# Patient Record
Sex: Female | Born: 1965 | Race: Black or African American | Hispanic: No | Marital: Single | Smoking: Never smoker
Health system: Southern US, Community
[De-identification: ages and names within clinical notes are randomized; demographics above are authoritative.]

## PROBLEM LIST (undated history)

## (undated) DIAGNOSIS — I10 Essential (primary) hypertension: Secondary | ICD-10-CM

## (undated) HISTORY — DX: Essential (primary) hypertension: I10

## (undated) HISTORY — PX: ANKLE FRACTURE SURGERY: SHX122

## (undated) HISTORY — PX: CHOLECYSTECTOMY: SHX55

---

## 2002-06-14 ENCOUNTER — Emergency Department (HOSPITAL_COMMUNITY): Admission: EM | Admit: 2002-06-14 | Discharge: 2002-06-14 | Payer: Self-pay | Admitting: Emergency Medicine

## 2003-06-15 ENCOUNTER — Emergency Department (HOSPITAL_COMMUNITY): Admission: EM | Admit: 2003-06-15 | Discharge: 2003-06-15 | Payer: Self-pay | Admitting: Emergency Medicine

## 2003-09-27 ENCOUNTER — Encounter: Payer: Self-pay | Admitting: Emergency Medicine

## 2003-09-27 ENCOUNTER — Emergency Department (HOSPITAL_COMMUNITY): Admission: EM | Admit: 2003-09-27 | Discharge: 2003-09-27 | Payer: Self-pay | Admitting: Emergency Medicine

## 2003-09-28 ENCOUNTER — Ambulatory Visit (HOSPITAL_COMMUNITY): Admission: RE | Admit: 2003-09-28 | Discharge: 2003-09-28 | Payer: Self-pay | Admitting: Unknown Physician Specialty

## 2003-09-28 ENCOUNTER — Encounter: Payer: Self-pay | Admitting: Unknown Physician Specialty

## 2005-01-09 ENCOUNTER — Ambulatory Visit: Payer: Self-pay | Admitting: Family Medicine

## 2005-02-17 ENCOUNTER — Ambulatory Visit: Payer: Self-pay | Admitting: Family Medicine

## 2005-02-21 ENCOUNTER — Ambulatory Visit: Payer: Self-pay | Admitting: Family Medicine

## 2005-05-23 ENCOUNTER — Ambulatory Visit: Payer: Self-pay | Admitting: Family Medicine

## 2005-06-06 ENCOUNTER — Ambulatory Visit: Payer: Self-pay | Admitting: Family Medicine

## 2005-07-24 ENCOUNTER — Ambulatory Visit: Payer: Self-pay | Admitting: Family Medicine

## 2005-08-12 ENCOUNTER — Ambulatory Visit: Payer: Self-pay | Admitting: Family Medicine

## 2005-08-27 ENCOUNTER — Ambulatory Visit: Payer: Self-pay | Admitting: Family Medicine

## 2005-09-26 ENCOUNTER — Ambulatory Visit: Payer: Self-pay | Admitting: Family Medicine

## 2005-11-18 ENCOUNTER — Ambulatory Visit: Payer: Self-pay | Admitting: Family Medicine

## 2005-12-01 ENCOUNTER — Ambulatory Visit: Payer: Self-pay | Admitting: Family Medicine

## 2005-12-18 ENCOUNTER — Ambulatory Visit: Payer: Self-pay | Admitting: Family Medicine

## 2006-03-24 ENCOUNTER — Ambulatory Visit: Payer: Self-pay | Admitting: Family Medicine

## 2009-11-27 ENCOUNTER — Emergency Department (HOSPITAL_COMMUNITY): Admission: EM | Admit: 2009-11-27 | Discharge: 2009-11-27 | Payer: Self-pay | Admitting: Emergency Medicine

## 2017-09-14 DIAGNOSIS — Z87891 Personal history of nicotine dependence: Secondary | ICD-10-CM | POA: Insufficient documentation

## 2017-09-14 DIAGNOSIS — D72819 Decreased white blood cell count, unspecified: Secondary | ICD-10-CM | POA: Insufficient documentation

## 2017-09-14 DIAGNOSIS — I10 Essential (primary) hypertension: Secondary | ICD-10-CM | POA: Insufficient documentation

## 2019-04-29 DIAGNOSIS — N644 Mastodynia: Secondary | ICD-10-CM | POA: Diagnosis not present

## 2019-04-29 DIAGNOSIS — I10 Essential (primary) hypertension: Secondary | ICD-10-CM | POA: Diagnosis not present

## 2019-04-29 DIAGNOSIS — Z1239 Encounter for other screening for malignant neoplasm of breast: Secondary | ICD-10-CM | POA: Diagnosis not present

## 2019-05-02 DIAGNOSIS — Z1211 Encounter for screening for malignant neoplasm of colon: Secondary | ICD-10-CM | POA: Diagnosis not present

## 2019-11-21 ENCOUNTER — Other Ambulatory Visit: Payer: Self-pay

## 2019-11-21 DIAGNOSIS — Z20822 Contact with and (suspected) exposure to covid-19: Secondary | ICD-10-CM

## 2019-11-22 LAB — NOVEL CORONAVIRUS, NAA: SARS-CoV-2, NAA: NOT DETECTED

## 2020-02-10 ENCOUNTER — Telehealth: Payer: Self-pay | Admitting: Physician Assistant

## 2020-02-10 ENCOUNTER — Other Ambulatory Visit: Payer: Self-pay

## 2020-02-10 ENCOUNTER — Encounter: Payer: Self-pay | Admitting: Physician Assistant

## 2020-02-10 DIAGNOSIS — K219 Gastro-esophageal reflux disease without esophagitis: Secondary | ICD-10-CM | POA: Insufficient documentation

## 2020-02-10 DIAGNOSIS — I10 Essential (primary) hypertension: Secondary | ICD-10-CM

## 2020-02-10 NOTE — Progress Notes (Signed)
Unable to reach through video link

## 2020-05-11 ENCOUNTER — Other Ambulatory Visit: Payer: Self-pay | Admitting: Family Medicine

## 2020-05-11 ENCOUNTER — Other Ambulatory Visit: Payer: Self-pay | Admitting: *Deleted

## 2020-05-11 ENCOUNTER — Telehealth: Payer: Self-pay | Admitting: Physician Assistant

## 2020-05-11 MED ORDER — BENAZEPRIL HCL 20 MG PO TABS: 20.0000 mg | ORAL_TABLET | Freq: Every day | ORAL | 0 refills | Status: DC

## 2020-05-11 MED ORDER — AMLODIPINE BESYLATE 10 MG PO TABS: 10.0000 mg | ORAL_TABLET | Freq: Every day | ORAL | 0 refills | Status: DC

## 2020-05-11 NOTE — Telephone Encounter (Signed)
Scheduled with new provider , Dr Tonny Branch June.  Former [facility] patient. Scripts are pending.

## 2020-05-11 NOTE — Telephone Encounter (Signed)
°  Prescription Request  05/11/2020  What is the name of the medication or equipment? amLODipine (NORVASC) 10 MG tablet  benazepril (LOTENSIN) 20 MG tablet  Have you contacted your pharmacy to request a refill? (if applicable) yes  Which pharmacy would you like this sent to? [facility] pt has appt with Dr. Reece Agar on 05/22/20   Patient notified that their request is being sent to the clinical staff for review and that they should receive a response within 2 business days.

## 2020-05-20 NOTE — Progress Notes (Signed)
Deanna Kennedy is a 54 y.o. female presents to office today for annual physical exam examination.    Concerns today include: 1.  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  Up to 6 beers per day on the weekends.  No alcohol use during the week.  She is trying to cut back on this Diet: Low-carb, high-fiber, Exercise: Regular structured exercise either on the elliptical or walking.  She is working on weight loss with successful 37 pound weight loss thus far Last eye exam: Needs.  Has not establish care with a new eye doctor locally Last colonoscopy: Never, would like to establish care in [place] Last mammogram: Needs at [facility] center.  No history of abnormal Last pap smear: Needs.  No history of abnormal.  No pelvic concerns  Past Medical History:  Diagnosis Date  . Hypertension    Social History   Socioeconomic History  . Marital status: Single    Spouse name: Not on file  . Number of children: 2  . Years of education: Not on file  . Highest education level: Not on file  Occupational History  . Not on file  Tobacco Use  . Smoking status: Never Smoker  . Smokeless tobacco: Never Used  Substance and Sexual Activity  . Alcohol use: Yes    Alcohol/week: 6.0 standard drinks    Types: 6 Cans of beer per week  . Drug use: Not on file  . Sexual activity: Not Currently  Other Topics Concern  . Not on file  Social History Narrative   Patient resides locally.  She used to work for [employer] and travels quite frequently.  However, she is now working for [employer] in [employer].  She has 1 daughter which resides in [place] and her other day her daughter resides in [place].   Social Determinants of Health   Financial Resource Strain:   . Difficulty of Paying Living Expenses:   Food Insecurity:   . Worried About Charity fundraiser in the Last Year:   . Arboriculturist in the  Last Year:   Transportation Needs:   . Film/video editor (Medical):   Marland Kitchen Lack of Transportation (Non-Medical):   Physical Activity:   . Days of Exercise per Week:   . Minutes of Exercise per Session:   Stress:   . Feeling of Stress :   Social Connections:   . Frequency of Communication with Friends and Family:   . Frequency of Social Gatherings with Friends and Family:   . Attends Religious Services:   . Active Member of Clubs or Organizations:   . Attends Archivist Meetings:   Marland Kitchen Marital Status:   Intimate Partner Violence:   . Fear of Current or Ex-Partner:   . Emotionally Abused:   Marland Kitchen Physically Abused:   . Sexually Abused:    Past Surgical History:  Procedure Laterality Date  . ANKLE FRACTURE SURGERY Left   . CESAREAN SECTION     x2  . CHOLECYSTECTOMY     Family History  Problem Relation Age of Onset  . Hypertension Mother   . Uterine cancer Mother   . Hypertension Father   . Stomach cancer Father     Current Outpatient Medications:  .  amLODipine (NORVASC) 10 MG tablet, Take 1 tablet (10 mg total) by mouth daily., Disp: 90 tablet, Rfl: 0 .  benazepril (LOTENSIN) 20 MG tablet, Take 1 tablet (  20 mg total) by mouth daily., Disp: 90 tablet, Rfl: 0 .  cetirizine (ZYRTEC) 10 MG tablet, Take 10 mg by mouth daily., Disp: , Rfl:  .  hydrochlorothiazide (MICROZIDE) 12.5 MG capsule, Take 12.5 mg by mouth daily., Disp: , Rfl:  .  omeprazole (PRILOSEC) 20 MG capsule, Take 20 mg by mouth daily., Disp: , Rfl:   Allergies  Allergen Reactions  . Penicillins Hives     ROS: Review of Systems A comprehensive review of systems was negative except for: Eyes: positive for contacts/glasses Musculoskeletal: positive for arthralgias    Physical exam BP 124/88   Pulse 99   Temp (!) 97.3 F (36.3 C) (Temporal)   Ht 7' (2.134 m)   Wt 200 lb (90.7 kg)   LMP  (LMP Unknown)   SpO2 98%   BMI 19.93 kg/m  General appearance: alert, cooperative, appears stated age, no  distress and mildly obese Head: Normocephalic, without obvious abnormality, atraumatic Eyes: negative findings: lids and lashes normal, conjunctivae and sclerae normal, corneas clear and pupils equal, round, reactive to light and accomodation Ears: normal TM's and external ear canals both ears Nose: Nares normal. Septum midline. Mucosa normal. No drainage or sinus tenderness. Throat: lips, mucosa, and tongue normal; teeth and gums normal Neck: no adenopathy, supple, symmetrical, trachea midline and thyroid not enlarged, symmetric, no tenderness/mass/nodules Back: symmetric, no curvature. ROM normal. No CVA tenderness. Lungs: clear to auscultation bilaterally Breasts: normal appearance, no masses or tenderness, Inspection negative, No nipple retraction or dimpling, No nipple discharge or bleeding, No axillary or supraclavicular adenopathy Heart: regular rate and rhythm, S1, S2 normal, no murmur, click, rub or gallop Abdomen: soft, non-tender; bowel sounds normal; no masses,  no organomegaly Pelvic: cervix normal in appearance, external genitalia normal, no adnexal masses or tenderness, no cervical motion tenderness, rectovaginal septum normal, uterus normal size, shape, and consistency and vagina normal without discharge Extremities: extremities normal, atraumatic, no cyanosis or edema Pulses: 2+ and symmetric Skin: Skin color, texture, turgor normal. No rashes or lesions Lymph nodes: Cervical, supraclavicular, and axillary nodes normal. Neurologic: Alert and oriented X 3, normal strength and tone. Normal symmetric reflexes. Normal coordination and gait Psych: Mood stable, speech normal, affect appropriate, pleasant and interactive Depression screen [facility] 2/9 05/22/2020  Decreased Interest 0  Down, Depressed, Hopeless 0  PHQ - 2 Score 0  Altered sleeping 0  Tired, decreased energy 0  Change in appetite 0  Feeling bad or failure about yourself  0  Trouble concentrating 0  Moving slowly or  fidgety/restless 0  Suicidal thoughts 0  PHQ-9 Score 0  Difficult doing work/chores Not difficult at all    Assessment/ Plan: [name] here for annual physical exam.   1. Annual physical exam  2. Screening for malignant neoplasm of cervix - Cytology - PAP([place])  3. Essential (primary) hypertension Controlled.  Continue current regimen.  We discussed that if her blood pressure continues to go down as she loses weight and it gets below 110/60 will plan to reduce the Norvasc to 5 mg daily.  4. Establishing care with new doctor, encounter for  5. Family history of thyroid disease Check thyroid level - TSH  6. Elevated serum glucose Noted on recent biometric scan at work.  She is on a low-carb diet so this is somewhat surprising.  Check A1c - Bayer DCA Hb A1c Waived  7. Screen for colon cancer No history of colonoscopy.  Referral to gastro - Ambulatory referral to Gastroenterology  8.  Encounter for screening mammogram for malignant neoplasm of breast Gets done at [facility] center in [place] - MM 3D SCREEN BREAST BILATERAL; Future   Handout on healthy lifestyle choices, including diet (rich in fruits, vegetables and lean meats and low in salt and simple carbohydrates) and exercise (at least 30 minutes of moderate physical activity daily).  Patient to follow up in 1 year for annual exam or sooner if needed.  Giovanny Dugal M. Nadine Counts, DO

## 2020-05-22 ENCOUNTER — Other Ambulatory Visit: Payer: Self-pay

## 2020-05-22 ENCOUNTER — Encounter: Payer: Self-pay | Admitting: Family Medicine

## 2020-05-22 ENCOUNTER — Ambulatory Visit (INDEPENDENT_AMBULATORY_CARE_PROVIDER_SITE_OTHER): Payer: 59 | Admitting: Family Medicine

## 2020-05-22 ENCOUNTER — Other Ambulatory Visit (HOSPITAL_COMMUNITY)
Admission: RE | Admit: 2020-05-22 | Discharge: 2020-05-22 | Disposition: A | Discharge status: Home / Self Care | Payer: 59 | Source: Ambulatory Visit | Attending: Family Medicine | Admitting: Family Medicine

## 2020-05-22 VITALS — BP 124/88 | HR 99 | Temp 97.3°F | Ht 67.0 in | Wt 200.0 lb

## 2020-05-22 DIAGNOSIS — Z0000 Encounter for general adult medical examination without abnormal findings: Secondary | ICD-10-CM

## 2020-05-22 DIAGNOSIS — Z1231 Encounter for screening mammogram for malignant neoplasm of breast: Secondary | ICD-10-CM

## 2020-05-22 DIAGNOSIS — I10 Essential (primary) hypertension: Secondary | ICD-10-CM | POA: Diagnosis not present

## 2020-05-22 DIAGNOSIS — Z8349 Family history of other endocrine, nutritional and metabolic diseases: Secondary | ICD-10-CM

## 2020-05-22 DIAGNOSIS — R739 Hyperglycemia, unspecified: Secondary | ICD-10-CM

## 2020-05-22 DIAGNOSIS — Z1211 Encounter for screening for malignant neoplasm of colon: Secondary | ICD-10-CM

## 2020-05-22 DIAGNOSIS — Z124 Encounter for screening for malignant neoplasm of cervix: Secondary | ICD-10-CM

## 2020-05-22 DIAGNOSIS — Z0001 Encounter for general adult medical examination with abnormal findings: Secondary | ICD-10-CM | POA: Diagnosis not present

## 2020-05-22 DIAGNOSIS — Z7689 Persons encountering health services in other specified circumstances: Secondary | ICD-10-CM

## 2020-05-22 LAB — BAYER DCA HB A1C WAIVED: HB A1C (BAYER DCA - WAIVED): 4.8 % (ref <7.0)

## 2020-05-22 MED ORDER — BENAZEPRIL HCL 20 MG PO TABS: 20.0000 mg | ORAL_TABLET | Freq: Every day | ORAL | 3 refills | Status: DC

## 2020-05-22 MED ORDER — CETIRIZINE HCL 10 MG PO TABS: 10.0000 mg | ORAL_TABLET | Freq: Every day | ORAL | 3 refills | Status: AC

## 2020-05-22 MED ORDER — AMLODIPINE BESYLATE 10 MG PO TABS: 10.0000 mg | ORAL_TABLET | Freq: Every day | ORAL | 3 refills | Status: DC

## 2020-05-22 MED ORDER — HYDROCHLOROTHIAZIDE 12.5 MG PO CAPS: 12.5000 mg | ORAL_CAPSULE | Freq: Every day | ORAL | 3 refills | Status: AC

## 2020-05-22 NOTE — Patient Instructions (Addendum)
You had labs performed today.  You will be contacted with the results of the labs once they are available, usually in the next 3 business days for routine lab work.  If you have an active my chart account, they will be released to your MyChart.  If you prefer to have these labs released to you via telephone, please let us know.  If you had a pap smear or biopsy performed, expect to be contacted in about 7-10 days.  Monitor your blood pressure regularly.  If your BP goes below 110/60, call me.  We will plan to reduce your Amlodipine.   Preventive Care 30-73 Years Old, Female Preventive care refers to visits with your health care provider and lifestyle choices that can promote health and wellness. This includes:  A yearly physical exam. This may also be called an annual well check.  Regular dental visits and eye exams.  Immunizations.  Screening for certain conditions.  Healthy lifestyle choices, such as eating a healthy diet, getting regular exercise, not using drugs or products that contain nicotine and tobacco, and limiting alcohol use. What can I expect for my preventive care visit? Physical exam Your health care provider will check your:  Height and weight. This may be used to calculate body mass index (BMI), which tells if you are at a healthy weight.  Heart rate and blood pressure.  Skin for abnormal spots. Counseling Your health care provider may ask you questions about your:  Alcohol, tobacco, and drug use.  Emotional well-being.  Home and relationship well-being.  Sexual activity.  Eating habits.  Work and work Statistician.  Method of birth control.  Menstrual cycle.  Pregnancy history. What immunizations do I need?  Influenza (flu) vaccine  This is recommended every year. Tetanus, diphtheria, and pertussis (Tdap) vaccine  You may need a Td booster every 10 years. Varicella (chickenpox) vaccine  You may need this if you have not been  vaccinated. Zoster (shingles) vaccine  You may need this after age 11. Measles, mumps, and rubella (MMR) vaccine  You may need at least one dose of MMR if you were born in 1957 or later. You may also need a second dose. Pneumococcal conjugate (PCV13) vaccine  You may need this if you have certain conditions and were not previously vaccinated. Pneumococcal polysaccharide (PPSV23) vaccine  You may need one or two doses if you smoke cigarettes or if you have certain conditions. Meningococcal conjugate (MenACWY) vaccine  You may need this if you have certain conditions. Hepatitis A vaccine  You may need this if you have certain conditions or if you travel or work in places where you may be exposed to hepatitis A. Hepatitis B vaccine  You may need this if you have certain conditions or if you travel or work in places where you may be exposed to hepatitis B. Haemophilus influenzae type b (Hib) vaccine  You may need this if you have certain conditions. Human papillomavirus (HPV) vaccine  If recommended by your health care provider, you may need three doses over 6 months. You may receive vaccines as individual doses or as more than one vaccine together in one shot (combination vaccines). Talk with your health care provider about the risks and benefits of combination vaccines. What tests do I need? Blood tests  Lipid and cholesterol levels. These may be checked every 5 years, or more frequently if you are over 28 years old.  Hepatitis C test.  Hepatitis B test. Screening  Lung cancer screening.  You may have this screening every year starting at age 37 if you have a 30-pack-year history of smoking and currently smoke or have quit within the past 15 years.  Colorectal cancer screening. All adults should have this screening starting at age 53 and continuing until age 57. Your health care provider may recommend screening at age 89 if you are at increased risk. You will have tests every  1-10 years, depending on your results and the type of screening test.  Diabetes screening. This is done by checking your blood sugar (glucose) after you have not eaten for a while (fasting). You may have this done every 1-3 years.  Mammogram. This may be done every 1-2 years. Talk with your health care provider about when you should start having regular mammograms. This may depend on whether you have a family history of breast cancer.  BRCA-related cancer screening. This may be done if you have a family history of breast, ovarian, tubal, or peritoneal cancers.  Pelvic exam and Pap test. This may be done every 3 years starting at age 53. Starting at age 16, this may be done every 5 years if you have a Pap test in combination with an HPV test. Other tests  Sexually transmitted disease (STD) testing.  Bone density scan. This is done to screen for osteoporosis. You may have this scan if you are at high risk for osteoporosis. Follow these instructions at home: Eating and drinking  Eat a diet that includes fresh fruits and vegetables, whole grains, lean protein, and low-fat dairy.  Take vitamin and mineral supplements as recommended by your health care provider.  Do not drink alcohol if: ? Your health care provider tells you not to drink. ? You are pregnant, may be pregnant, or are planning to become pregnant.  If you drink alcohol: ? Limit how much you have to 0-1 drink a day. ? Be aware of how much alcohol is in your drink. In the [place] one drink equals one 12 oz bottle of beer (355 mL), one 5 oz glass of wine (148 mL), or one 1 oz glass of hard liquor (44 mL). Lifestyle  Take daily care of your teeth and gums.  Stay active. Exercise for at least 30 minutes on 5 or more days each week.  Do not use any products that contain nicotine or tobacco, such as cigarettes, e-cigarettes, and chewing tobacco. If you need help quitting, ask your health care provider.  If you are sexually active,  practice safe sex. Use a condom or other form of birth control (contraception) in order to prevent pregnancy and STIs (sexually transmitted infections).  If told by your health care provider, take low-dose aspirin daily starting at age 54. What's next?  Visit your health care provider once a year for a well check visit.  Ask your health care provider how often you should have your eyes and teeth checked.  Stay up to date on all vaccines. This information is not intended to replace advice given to you by your health care provider. Make sure you discuss any questions you have with your health care provider. Document Revised: 08/19/2018 Document Reviewed: 08/19/2018 Elsevier Patient Education  2020 [employer].

## 2020-05-23 LAB — CYTOLOGY - PAP
Comment: NEGATIVE
Diagnosis: NEGATIVE
High risk HPV: NEGATIVE

## 2020-05-23 LAB — TSH: TSH: 1.39 u[IU]/mL (ref 0.450–4.500)

## 2020-05-24 ENCOUNTER — Encounter: Payer: Self-pay | Admitting: Internal Medicine

## 2020-05-28 ENCOUNTER — Encounter: Payer: Self-pay | Admitting: Family Medicine

## 2020-06-22 ENCOUNTER — Ambulatory Visit (HOSPITAL_COMMUNITY)
Admission: RE | Admit: 2020-06-22 | Discharge: 2020-06-22 | Disposition: A | Discharge status: Home / Self Care | Payer: 59 | Source: Ambulatory Visit | Attending: Family Medicine | Admitting: Family Medicine

## 2020-06-22 ENCOUNTER — Other Ambulatory Visit: Payer: Self-pay

## 2020-06-22 DIAGNOSIS — Z1231 Encounter for screening mammogram for malignant neoplasm of breast: Secondary | ICD-10-CM | POA: Diagnosis present

## 2020-06-28 ENCOUNTER — Encounter (HOSPITAL_COMMUNITY): Payer: 59

## 2020-08-02 ENCOUNTER — Ambulatory Visit: Payer: 59

## 2020-09-10 ENCOUNTER — Other Ambulatory Visit: Payer: Self-pay

## 2020-09-10 ENCOUNTER — Ambulatory Visit (INDEPENDENT_AMBULATORY_CARE_PROVIDER_SITE_OTHER): Payer: Self-pay | Admitting: *Deleted

## 2020-09-10 DIAGNOSIS — Z1211 Encounter for screening for malignant neoplasm of colon: Secondary | ICD-10-CM

## 2020-09-10 NOTE — Progress Notes (Signed)
Gastroenterology Pre-Procedure Review  Request Date: 09/10/2020 Requesting Physician: Doylene Canard, DO, no previous TCS  PATIENT REVIEW QUESTIONS: The patient responded to the following health history questions as indicated:    1. Diabetes Melitis: no 2. Joint replacements in the past 12 months: no 3. Major health problems in the past 3 months: no 4. Has an artificial valve or MVP: no 5. Has a defibrillator: no 6. Has been advised in past to take antibiotics in advance of a procedure like teeth cleaning: no 7. Family history of colon cancer: no  8. Alcohol Use: yes, 16 beers on weekends 9. Illicit drug Use: no 10. History of sleep apnea: no  11. History of coronary artery or other vascular stents placed within the last 12 months: no 12. History of any prior anesthesia complications: no 13. There is no height or weight on file to calculate BMI. ht: 5'7 wt: 203 lbs    MEDICATIONS & ALLERGIES:    Patient reports the following regarding taking any blood thinners:   Plavix? no Aspirin? no Coumadin? no Brilinta? no Xarelto? no Eliquis? no Pradaxa? no Savaysa? no Effient? no  Patient confirms/reports the following medications:  Current Outpatient Medications  Medication Sig Dispense Refill  . amLODipine (NORVASC) 10 MG tablet Take 1 tablet (10 mg total) by mouth daily. 90 tablet 3  . Ascorbic Acid (VITAMIN C PO) Take by mouth. Takes 2000 mg daily.    . benazepril (LOTENSIN) 20 MG tablet Take 1 tablet (20 mg total) by mouth daily. 90 tablet 3  . BIOTIN PO Take by mouth daily.    . cetirizine (ZYRTEC) 10 MG tablet Take 1 tablet (10 mg total) by mouth daily. 90 tablet 3  . Cholecalciferol (VITAMIN D3) 50 MCG (2000 UT) TABS Take by mouth daily.    . Cyanocobalamin (B-12 PO) Take by mouth daily.    . hydrochlorothiazide (MICROZIDE) 12.5 MG capsule Take 1 capsule (12.5 mg total) by mouth daily. 90 capsule 3  . Multiple Vitamins-Minerals (MULTIVITAMIN WOMENS 50+ ADV PO) Take by  mouth daily.    . Probiotic Product (PROBIOTIC PO) Take by mouth daily.     No current facility-administered medications for this visit.    Patient confirms/reports the following allergies:  Allergies  Allergen Reactions  . Penicillins Hives    No orders of the defined types were placed in this encounter.   AUTHORIZATION INFORMATION Primary Insurance: [place],  [place] #: [id number],  Group #: Pre-Cert / Auth required:  Pre-Cert / Auth #:   SCHEDULE INFORMATION: Procedure has been scheduled as follows:  Date: , Time:  Location:   This Gastroenterology Pre-Precedure Review Form is being routed to the following provider(s): Ermalinda Memos, [place]

## 2020-09-10 NOTE — Progress Notes (Signed)
Patient will need propofol due to alcohol. Recommend OV to arrange.

## 2020-09-11 ENCOUNTER — Telehealth: Payer: Self-pay | Admitting: *Deleted

## 2020-09-11 NOTE — Telephone Encounter (Signed)
Lmom for pt to call me back.  Needs ov due to ETOH.

## 2020-09-12 NOTE — Telephone Encounter (Signed)
Lmom for pt to call back. 

## 2020-09-13 NOTE — Progress Notes (Signed)
Called pt and informed her that our providers recommend an ov to arrange colonoscopy.  Pt voiced understanding.  OV scheduled for 10/26/2020 at 8:00 with Ermalinda Memos, PA.

## 2020-10-26 ENCOUNTER — Ambulatory Visit: Payer: Self-pay | Admitting: Gastroenterology

## 2020-11-05 ENCOUNTER — Ambulatory Visit: Payer: 59

## 2020-11-21 ENCOUNTER — Ambulatory Visit: Payer: 59 | Admitting: Family Medicine

## 2020-11-21 NOTE — Progress Notes (Deleted)
Subjective: CC: F/u HTN PCP: Raliegh Ip, DO [id number] L Lao is a 54 y.o. female presenting to clinic today for:  1. Hypertension ***   ROS: Per HPI  Allergies  Allergen Reactions  . Penicillins Hives   Past Medical History:  Diagnosis Date  . Hypertension     Current Outpatient Medications:  .  amLODipine (NORVASC) 10 MG tablet, Take 1 tablet (10 mg total) by mouth daily., Disp: 90 tablet, Rfl: 3 .  Ascorbic Acid (VITAMIN C PO), Take by mouth. Takes 2000 mg daily., Disp: , Rfl:  .  benazepril (LOTENSIN) 20 MG tablet, Take 1 tablet (20 mg total) by mouth daily., Disp: 90 tablet, Rfl: 3 .  BIOTIN PO, Take by mouth daily., Disp: , Rfl:  .  cetirizine (ZYRTEC) 10 MG tablet, Take 1 tablet (10 mg total) by mouth daily., Disp: 90 tablet, Rfl: 3 .  Cholecalciferol (VITAMIN D3) 50 MCG (2000 UT) TABS, Take by mouth daily., Disp: , Rfl:  .  Cyanocobalamin (B-12 PO), Take by mouth daily., Disp: , Rfl:  .  hydrochlorothiazide (MICROZIDE) 12.5 MG capsule, Take 1 capsule (12.5 mg total) by mouth daily., Disp: 90 capsule, Rfl: 3 .  Multiple Vitamins-Minerals (MULTIVITAMIN WOMENS 50+ ADV PO), Take by mouth daily., Disp: , Rfl:  .  Probiotic Product (PROBIOTIC PO), Take by mouth daily., Disp: , Rfl:  Social History   Socioeconomic History  . Marital status: Single    Spouse name: Not on file  . Number of children: 2  . Years of education: Not on file  . Highest education level: Not on file  Occupational History  . Not on file  Tobacco Use  . Smoking status: Never Smoker  . Smokeless tobacco: Never Used  Substance and Sexual Activity  . Alcohol use: Yes    Alcohol/week: 6.0 standard drinks    Types: 6 Cans of beer per week  . Drug use: Not on file  . Sexual activity: Not Currently  Other Topics Concern  . Not on file  Social History Narrative   Patient resides locally.  She used to work for [employer] and travels quite frequently.  However, she is now  working for [employer] in [place].  She has 1 daughter which resides in [place] and her other day her daughter resides in [place].   Social Determinants of Health   Financial Resource Strain:   . Difficulty of Paying Living Expenses: Not on file  Food Insecurity:   . Worried About Programme researcher, broadcasting/film/video in the Last Year: Not on file  . Ran Out of Food in the Last Year: Not on file  Transportation Needs:   . Lack of Transportation (Medical): Not on file  . Lack of Transportation (Non-Medical): Not on file  Physical Activity:   . Days of Exercise per Week: Not on file  . Minutes of Exercise per Session: Not on file  Stress:   . Feeling of Stress : Not on file  Social Connections:   . Frequency of Communication with Friends and Family: Not on file  . Frequency of Social Gatherings with Friends and Family: Not on file  . Attends Religious Services: Not on file  . Active Member of Clubs or Organizations: Not on file  . Attends Banker Meetings: Not on file  . Marital Status: Not on file  Intimate Partner Violence:   . Fear of Current or Ex-Partner: Not on file  . Emotionally Abused: Not on file  .  Physically Abused: Not on file  . Sexually Abused: Not on file   Family History  Problem Relation Age of Onset  . Hypertension Mother   . Uterine cancer Mother   . Hypertension Father   . Stomach cancer Father     Objective: Office vital signs reviewed. LMP  (LMP Unknown)   Physical Examination:  General: Awake, alert, *** nourished, No acute distress HEENT: Normal    Neck: No masses palpated. No lymphadenopathy    Ears: Tympanic membranes intact, normal light reflex, no erythema, no bulging    Eyes: PERRLA, extraocular membranes intact, sclera ***    Nose: nasal turbinates moist, *** nasal discharge    Throat: moist mucus membranes, no erythema, *** tonsillar exudate.  Airway is patent Cardio: regular rate and rhythm, S1S2 heard, no murmurs  appreciated Pulm: clear to auscultation bilaterally, no wheezes, rhonchi or rales; normal work of breathing on room air GI: soft, non-tender, non-distended, bowel sounds present x4, no hepatomegaly, no splenomegaly, no masses GU: external vaginal tissue ***, cervix ***, *** punctate lesions on cervix appreciated, *** discharge from cervical os, *** bleeding, *** cervical motion tenderness, *** abdominal/ adnexal masses Extremities: warm, well perfused, No edema, cyanosis or clubbing; +*** pulses bilaterally MSK: *** gait and *** station Skin: dry; intact; no rashes or lesions Neuro: *** Strength and light touch sensation grossly intact, *** DTRs ***/4  Assessment/ Plan: 54 y.o. female   ***  No orders of the defined types were placed in this encounter.  No orders of the defined types were placed in this encounter.    Raliegh Ip, DO [facility] [phone]

## 2020-11-29 ENCOUNTER — Ambulatory Visit: Payer: Self-pay | Admitting: Gastroenterology

## 2020-12-27 ENCOUNTER — Other Ambulatory Visit: Payer: Self-pay

## 2020-12-27 DIAGNOSIS — Z20822 Contact with and (suspected) exposure to covid-19: Secondary | ICD-10-CM | POA: Diagnosis not present

## 2020-12-28 ENCOUNTER — Encounter: Payer: Self-pay | Admitting: Family Medicine

## 2020-12-29 LAB — SARS-COV-2, NAA 2 DAY TAT

## 2020-12-29 LAB — NOVEL CORONAVIRUS, NAA: SARS-CoV-2, NAA: NOT DETECTED

## 2021-01-01 ENCOUNTER — Ambulatory Visit (INDEPENDENT_AMBULATORY_CARE_PROVIDER_SITE_OTHER): Payer: BC Managed Care – PPO | Admitting: Nurse Practitioner

## 2021-01-01 ENCOUNTER — Encounter: Payer: Self-pay | Admitting: Nurse Practitioner

## 2021-01-01 DIAGNOSIS — J0110 Acute frontal sinusitis, unspecified: Secondary | ICD-10-CM | POA: Diagnosis not present

## 2021-01-01 MED ORDER — FLUTICASONE PROPIONATE 50 MCG/ACT NA SUSP: 2.0000 | Freq: Every day | NASAL | 1 refills | Status: DC

## 2021-01-01 MED ORDER — ACETAMINOPHEN 500 MG PO TABS: 500.0000 mg | ORAL_TABLET | Freq: Four times a day (QID) | ORAL | 0 refills | Status: DC | PRN

## 2021-01-01 MED ORDER — AZITHROMYCIN 250 MG PO TABS: ORAL_TABLET | ORAL | 0 refills | Status: DC

## 2021-01-01 MED ORDER — DM-GUAIFENESIN ER 30-600 MG PO TB12: 1.0000 | ORAL_TABLET | Freq: Two times a day (BID) | ORAL | 0 refills | Status: DC

## 2021-01-01 NOTE — Progress Notes (Signed)
   Virtual Visit via telephone Note Due to COVID-19 pandemic this visit was conducted virtually. This visit type was conducted due to national recommendations for restrictions regarding the COVID-19 Pandemic (e.g. social distancing, sheltering in place) in an effort to limit this patient's exposure and mitigate transmission in our community. All issues noted in this document were discussed and addressed.  A physical exam was not performed with this format.  I connected with Deanna Kennedy on 01/01/21 at  9:40 AM by telephone and verified that I am speaking with the correct person using two identifiers. Deanna Kennedy is currently located at home and no one is currently with patient during visit. The provider, Daryll Drown, NP is located in their office at time of visit.  I discussed the limitations, risks, security and privacy concerns of performing an evaluation and management service by telephone and the availability of in person appointments. I also discussed with the patient that there may be a patient responsible charge related to this service. The patient expressed understanding and agreed to proceed.   History and Present Illness:  Sinusitis This is a new problem. Episode onset: In the past 11 days. The problem is unchanged. There has been no fever. The pain is moderate. Associated symptoms include congestion, coughing, headaches and sinus pressure. Pertinent negatives include no chills, neck pain, shortness of breath, sore throat or swollen glands. Past treatments include spray decongestants, acetaminophen and oral decongestants. The treatment provided no relief.      Review of Systems  Constitutional: Positive for malaise/fatigue. Negative for chills and fever.  HENT: Positive for congestion and sinus pressure. Negative for sore throat.   Respiratory: Positive for cough. Negative for shortness of breath.   Musculoskeletal: Negative for neck pain.  Neurological: Positive for  headaches.  All other systems reviewed and are negative.    Observations/Objective:  Telephone visit  Assessment and Plan:  Subacute frontal sinusitis Sinusitis not well controlled.  Patient has had symptoms for 11 days.  Patient has tried Tylenol, Flonase, increase hydration and Mucinex with no therapeutic effect.  Started patient on Z-Pak.  Encouraged to increase hydration, Mucinex for cough and congestion.  Tylenol for headache and continue with Flonase. Follow-up with worsening unresolved symptoms or fever.  Patient had COVID testing 3 days ago with negative results.  Rx sent to pharmacy.  Follow Up Instructions:  Follow-up with worsening hours of symptoms.   I discussed the assessment and treatment plan with the patient. The patient was provided an opportunity to ask questions and all were answered. The patient agreed with the plan and demonstrated an understanding of the instructions.   The patient was advised to call back or seek an in-person evaluation if the symptoms worsen or if the condition fails to improve as anticipated.  The above assessment and management plan was discussed with the patient. The patient verbalized understanding of and has agreed to the management plan. Patient is aware to call the clinic if symptoms persist or worsen. Patient is aware when to return to the clinic for a follow-up visit. Patient educated on when it is appropriate to go to the emergency department.   Time call ended: 9:52 AM  I provided 12 minutes of non-face-to-face time during this encounter.    Daryll Drown, NP

## 2021-01-01 NOTE — Assessment & Plan Note (Signed)
Sinusitis not well controlled.  Patient has had symptoms for 11 days.  Patient has tried Tylenol, Flonase, increase hydration and Mucinex with no therapeutic effect.  Started patient on Z-Pak.  Encouraged to increase hydration, Mucinex for cough and congestion.  Tylenol for headache and continue with Flonase. Follow-up with worsening unresolved symptoms or fever.  Patient had COVID testing 3 days ago with negative results.  Rx sent to pharmacy.

## 2021-01-09 NOTE — Progress Notes (Deleted)
Referring Provider: Doylene Canard, DO Primary Care Physician:  Raliegh Ip, DO Primary Gastroenterologist:  Dr. Jena Gauss  No chief complaint on file.   HPI:   Deanna Kennedy is a 55 y.o. female presenting today at the request of Doylene Canard, DO for consult colonoscopy.  No prior colonoscopy.  Patient was originally triaged; however, due to alcohol use, patient would need propofol for sedation, so office visit was arranged.     Past Medical History:  Diagnosis Date  . Hypertension     Past Surgical History:  Procedure Laterality Date  . ANKLE FRACTURE SURGERY Left   . CESAREAN SECTION     x2  . CHOLECYSTECTOMY      Current Outpatient Medications  Medication Sig Dispense Refill  . acetaminophen (TYLENOL) 500 MG tablet Take 1 tablet (500 mg total) by mouth every 6 (six) hours as needed. 30 tablet 0  . amLODipine (NORVASC) 10 MG tablet Take 1 tablet (10 mg total) by mouth daily. 90 tablet 3  . Ascorbic Acid (VITAMIN C PO) Take by mouth. Takes 2000 mg daily.    Marland Kitchen azithromycin (ZITHROMAX) 250 MG tablet 500 mg [2 tablets] day 1, 250 mg [1 tablet] day 2-5 6 tablet 0  . benazepril (LOTENSIN) 20 MG tablet Take 1 tablet (20 mg total) by mouth daily. 90 tablet 3  . BIOTIN PO Take by mouth daily.    . cetirizine (ZYRTEC) 10 MG tablet Take 1 tablet (10 mg total) by mouth daily. 90 tablet 3  . Cholecalciferol (VITAMIN D3) 50 MCG (2000 UT) TABS Take by mouth daily.    . Cyanocobalamin (B-12 PO) Take by mouth daily.    Marland Kitchen dextromethorphan-guaiFENesin (MUCINEX DM) 30-600 MG 12hr tablet Take 1 tablet by mouth 2 (two) times daily. 30 tablet 0  . fluticasone (FLONASE) 50 MCG/ACT nasal spray Place 2 sprays into both nostrils daily. 16 g 1  . hydrochlorothiazide (MICROZIDE) 12.5 MG capsule Take 1 capsule (12.5 mg total) by mouth daily. 90 capsule 3  . Multiple Vitamins-Minerals (MULTIVITAMIN WOMENS 50+ ADV PO) Take by mouth daily.    . Probiotic Product (PROBIOTIC PO) Take by  mouth daily.     No current facility-administered medications for this visit.    Allergies as of 01/10/2021 - Review Complete 01/01/2021  Allergen Reaction Noted  . Penicillins Hives 07/28/2014    Family History  Problem Relation Age of Onset  . Hypertension Mother   . Uterine cancer Mother   . Hypertension Father   . Stomach cancer Father     Social History   Socioeconomic History  . Marital status: Single    Spouse name: Not on file  . Number of children: 2  . Years of education: Not on file  . Highest education level: Not on file  Occupational History  . Not on file  Tobacco Use  . Smoking status: Never Smoker  . Smokeless tobacco: Never Used  Substance and Sexual Activity  . Alcohol use: Yes    Alcohol/week: 6.0 standard drinks    Types: 6 Cans of beer per week  . Drug use: Not on file  . Sexual activity: Not Currently  Other Topics Concern  . Not on file  Social History Narrative   Patient resides locally.  She used to work for [employer] and travels quite frequently.  However, she is now working for [employer] in [place].  She has 1 daughter which resides in [place] and her other day her daughter resides in  [place].   Social Determinants of Health   Financial Resource Strain: Not on file  Food Insecurity: Not on file  Transportation Needs: Not on file  Physical Activity: Not on file  Stress: Not on file  Social Connections: Not on file  Intimate Partner Violence: Not on file    Review of Systems: Gen: Denies any fever, chills, fatigue, weight loss, lack of appetite.  CV: Denies chest pain, heart palpitations, peripheral edema, syncope.  Resp: Denies shortness of breath at rest or with exertion. Denies wheezing or cough.  GI: Denies dysphagia or odynophagia. Denies jaundice, hematemesis, fecal incontinence. GU : Denies urinary burning, urinary frequency, urinary hesitancy MS: Denies joint pain, muscle weakness, cramps, or  limitation of movement.  Derm: Denies rash, itching, dry skin Psych: Denies depression, anxiety, memory loss, and confusion Heme: Denies bruising, bleeding, and enlarged lymph nodes.  Physical Exam: LMP  (LMP Unknown)  General:   Alert and oriented. Pleasant and cooperative. Well-nourished and well-developed.  Head:  Normocephalic and atraumatic. Eyes:  Without icterus, sclera clear and conjunctiva pink.  Ears:  Normal auditory acuity. Nose:  No deformity, discharge,  or lesions. Mouth:  No deformity or lesions, oral mucosa pink.  Neck:  Supple, without mass or thyromegaly. Lungs:  Clear to auscultation bilaterally. No wheezes, rales, or rhonchi. No distress.  Heart:  S1, S2 present without murmurs appreciated.  Abdomen:  +BS, soft, non-tender and non-distended. No HSM noted. No guarding or rebound. No masses appreciated.  Rectal:  Deferred  Msk:  Symmetrical without gross deformities. Normal posture. Pulses:  Normal pulses noted. Extremities:  Without clubbing or edema. Neurologic:  Alert and  oriented x4;  grossly normal neurologically. Skin:  Intact without significant lesions or rashes. Cervical Nodes:  No significant cervical adenopathy. Psych:  Alert and cooperative. Normal mood and affect.

## 2021-01-10 ENCOUNTER — Ambulatory Visit: Payer: Self-pay | Admitting: Gastroenterology

## 2021-01-10 ENCOUNTER — Encounter: Payer: Self-pay | Admitting: Internal Medicine

## 2021-02-20 ENCOUNTER — Encounter: Payer: Self-pay | Admitting: Family Medicine

## 2021-02-20 ENCOUNTER — Other Ambulatory Visit: Payer: Self-pay

## 2021-02-20 ENCOUNTER — Ambulatory Visit (INDEPENDENT_AMBULATORY_CARE_PROVIDER_SITE_OTHER): Payer: BC Managed Care – PPO | Admitting: Family Medicine

## 2021-02-20 VITALS — BP 104/69 | HR 99 | Temp 97.5°F | Ht 67.0 in | Wt 209.0 lb

## 2021-02-20 DIAGNOSIS — R1084 Generalized abdominal pain: Secondary | ICD-10-CM | POA: Diagnosis not present

## 2021-02-20 DIAGNOSIS — Z1211 Encounter for screening for malignant neoplasm of colon: Secondary | ICD-10-CM

## 2021-02-20 NOTE — Progress Notes (Signed)
Acute Office Visit  Subjective:    Patient ID: Deanna Kennedy, female    DOB: 10/11/1966, 54 y.o.   MRN: [id number]  Chief Complaint  Patient presents with  . Abdominal Pain    HPI Patient is in today for abdominal pain for about 2 weeks. It is located on both sides and sometimes in the epigastric area. The pain is intermittent and is a dull pain. Pain is a 2-3/10. The pain seems worse when there clothes are tight. Denies nausea or vomiting, changes in bowel habits, blood in stools, or fever. Denies heartburn or difficulty swallowing. Reports daily BM that is easy to pass. Denies dysuria. Denies diarrhea.   She reports gaining about 15 pounds after the death of her father when she started eating whatever she wanted. She had a physical last week at work and her cholesterol was elevated. She started on a Mediterranean diet this week and also started exersicing daily with the elliptical and treadmill. She also reports that she has started fasting 2 days a week with only water. She does not do back to back days of fasting.   She has never had a colonoscopy and had a negative cologuard in the past. She was referred to GI last year for a colonoscopy but had to cancel this. She would like a new referral placed.   Past Medical History:  Diagnosis Date  . Hypertension     Past Surgical History:  Procedure Laterality Date  . ANKLE FRACTURE SURGERY Left   . CESAREAN SECTION     x2  . CHOLECYSTECTOMY      Family History  Problem Relation Age of Onset  . Hypertension Mother   . Uterine cancer Mother   . Hypertension Father   . Stomach cancer Father     Social History   Socioeconomic History  . Marital status: Single    Spouse name: Not on file  . Number of children: 2  . Years of education: Not on file  . Highest education level: Not on file  Occupational History  . Not on file  Tobacco Use  . Smoking status: Never Smoker  . Smokeless tobacco: Never Used  Substance and  Sexual Activity  . Alcohol use: Yes    Alcohol/week: 6.0 standard drinks    Types: 6 Cans of beer per week  . Drug use: Not on file  . Sexual activity: Not Currently  Other Topics Concern  . Not on file  Social History Narrative   Patient resides locally.  She used to work for [employer] and travels quite frequently.  However, she is now working for [employer] in [place].  She has 1 daughter which resides in [place] and her other day her daughter resides in [place].   Social Determinants of Health   Financial Resource Strain: Not on file  Food Insecurity: Not on file  Transportation Needs: Not on file  Physical Activity: Not on file  Stress: Not on file  Social Connections: Not on file  Intimate Partner Violence: Not on file    Outpatient Medications Prior to Visit  Medication Sig Dispense Refill  . amLODipine (NORVASC) 10 MG tablet Take 1 tablet (10 mg total) by mouth daily. 90 tablet 3  . Ascorbic Acid (VITAMIN C PO) Take by mouth. Takes 2000 mg daily.    . benazepril (LOTENSIN) 20 MG tablet Take 1 tablet (20 mg total) by mouth daily. 90 tablet 3  . BIOTIN PO Take by mouth daily.    Marland Kitchen  cetirizine (ZYRTEC) 10 MG tablet Take 1 tablet (10 mg total) by mouth daily. 90 tablet 3  . Cholecalciferol (VITAMIN D3) 50 MCG (2000 UT) TABS Take by mouth daily.    . Cyanocobalamin (B-12 PO) Take by mouth daily.    . fluticasone (FLONASE) 50 MCG/ACT nasal spray Place 2 sprays into both nostrils daily. 16 g 1  . hydrochlorothiazide (MICROZIDE) 12.5 MG capsule Take 1 capsule (12.5 mg total) by mouth daily. 90 capsule 3  . Multiple Vitamins-Minerals (MULTIVITAMIN WOMENS 50+ ADV PO) Take by mouth daily.    . Probiotic Product (PROBIOTIC PO) Take by mouth daily.    Marland Kitchen acetaminophen (TYLENOL) 500 MG tablet Take 1 tablet (500 mg total) by mouth every 6 (six) hours as needed. 30 tablet 0  . azithromycin (ZITHROMAX) 250 MG tablet 500 mg [2 tablets] day 1, 250 mg [1 tablet]  day 2-5 6 tablet 0  . dextromethorphan-guaiFENesin (MUCINEX DM) 30-600 MG 12hr tablet Take 1 tablet by mouth 2 (two) times daily. 30 tablet 0   No facility-administered medications prior to visit.    Allergies  Allergen Reactions  . Penicillins Hives    Review of Systems As per HPI.     Objective:    Physical Exam Vitals and nursing note reviewed.  Constitutional:      General: She is not in acute distress.    Appearance: She is not ill-appearing, toxic-appearing or diaphoretic.  HENT:     Head: Normocephalic and atraumatic.  Cardiovascular:     Rate and Rhythm: Normal rate and regular rhythm.     Heart sounds: Normal heart sounds. No murmur heard.   Pulmonary:     Effort: Pulmonary effort is normal.     Breath sounds: Normal breath sounds.  Abdominal:     General: Abdomen is flat. Bowel sounds are normal. There is no distension or abdominal bruit.     Palpations: Abdomen is soft. There is no shifting dullness, hepatomegaly, mass or pulsatile mass.     Tenderness: There is no abdominal tenderness. There is no right CVA tenderness, left CVA tenderness, guarding or rebound. Negative signs include Murphy's sign, Rovsing's sign, McBurney's sign, psoas sign and obturator sign.     Hernia: No hernia is present.  Skin:    General: Skin is warm and dry.     Findings: No rash.  Neurological:     General: No focal deficit present.     Mental Status: She is alert and oriented to person, place, and time.  Psychiatric:        Mood and Affect: Mood normal.        Behavior: Behavior normal.     BP 104/69   Pulse 99   Temp (!) 97.5 F (36.4 C) (Temporal)   Ht 5' 7"  (1.702 m)   Wt 209 lb (94.8 kg)   LMP  (LMP Unknown)   BMI 32.73 kg/m  Wt Readings from Last 3 Encounters:  02/20/21 209 lb (94.8 kg)  05/22/20 200 lb (90.7 kg)    Health Maintenance Due  Topic Date Due  . Hepatitis C Screening  Never done  . COVID-19 Vaccine (1) Never done  . HIV Screening  Never done  .  COLONOSCOPY (Pts 45-89yr Insurance coverage will need to be confirmed)  Never done  . INFLUENZA VACCINE  Never done    There are no preventive care reminders to display for this patient.   Lab Results  Component Value Date   TSH 1.390 05/22/2020  No results found for: WBC, HGB, HCT, MCV, PLT No results found for: NA, K, CHLORIDE, CO2, GLUCOSE, BUN, CREATININE, BILITOT, ALKPHOS, AST, ALT, PROT, ALBUMIN, CALCIUM, ANIONGAP, EGFR, GFR No results found for: CHOL No results found for: HDL No results found for: LDLCALC No results found for: TRIG No results found for: CHOLHDL Lab Results  Component Value Date   HGBA1C 4.8 05/22/2020       Assessment & Plan:   Deanna Kennedy was seen today for abdominal pain.  Diagnoses and all orders for this visit:  Generalized abdominal pain Normal exam today, no red flags. Discussed that symptoms may be lifestyle related with previous unhealthy diet and drastic change to healthy diet along with full day fasting. Discussed that elliptical also works out abdominal muscles and could cause some abdominal soreness. Patient will continue healthy diet but not fast and will continue exercise and stay well hydrated. Return to office for new or worsening symptoms, or if symptoms persist.   Screening for colon cancer Referral placed to GI.  -     Ambulatory referral to Gastroenterology  The patient indicates understanding of these issues and agrees with the plan.  Gwenlyn Perking, FNP

## 2021-02-20 NOTE — Patient Instructions (Signed)
Abdominal Pain, Adult Pain in the abdomen (abdominal pain) can be caused by many things. Often, abdominal pain is not serious and it gets better with no treatment or by being treated at home. However, sometimes abdominal pain is serious. Your health care provider will ask questions about your medical history and do a physical exam to try to determine the cause of your abdominal pain. Follow these instructions at home: Medicines  Take over-the-counter and prescription medicines only as told by your health care provider.  Do not take a laxative unless told by your health care provider. General instructions  Watch your condition for any changes.  Drink enough fluid to keep your urine pale yellow.  Keep all follow-up visits as told by your health care provider. This is important.   Contact a health care provider if:  Your abdominal pain changes or gets worse.  You are not hungry or you lose weight without trying.  You are constipated or have diarrhea for more than 2-3 days.  You have pain when you urinate or have a bowel movement.  Your abdominal pain wakes you up at night.  Your pain gets worse with meals, after eating, or with certain foods.  You are vomiting and cannot keep anything down.  You have a fever.  You have blood in your urine. Get help right away if:  Your pain does not go away as soon as your health care provider told you to expect.  You cannot stop vomiting.  Your pain is only in areas of the abdomen, such as the right side or the left lower portion of the abdomen. Pain on the right side could be caused by appendicitis.  You have bloody or black stools, or stools that look like tar.  You have severe pain, cramping, or bloating in your abdomen.  You have signs of dehydration, such as: ? Dark urine, very little urine, or no urine. ? Cracked lips. ? Dry mouth. ? Sunken eyes. ? Sleepiness. ? Weakness.  You have trouble breathing or chest  pain. Summary  Often, abdominal pain is not serious and it gets better with no treatment or by being treated at home. However, sometimes abdominal pain is serious.  Watch your condition for any changes.  Take over-the-counter and prescription medicines only as told by your health care provider.  Contact a health care provider if your abdominal pain changes or gets worse.  Get help right away if you have severe pain, cramping, or bloating in your abdomen. This information is not intended to replace advice given to you by your health care provider. Make sure you discuss any questions you have with your health care provider. Document Revised: 01/27/2020 Document Reviewed: 04/18/2019 Elsevier Patient Education  2021 Elsevier Inc.  

## 2021-02-21 ENCOUNTER — Encounter: Payer: Self-pay | Admitting: Internal Medicine

## 2021-03-13 ENCOUNTER — Other Ambulatory Visit: Payer: Self-pay | Admitting: *Deleted

## 2021-03-13 DIAGNOSIS — J0110 Acute frontal sinusitis, unspecified: Secondary | ICD-10-CM

## 2021-03-13 MED ORDER — FLUTICASONE PROPIONATE 50 MCG/ACT NA SUSP: 2.0000 | Freq: Every day | NASAL | 1 refills | Status: AC

## 2021-03-25 ENCOUNTER — Encounter: Payer: Self-pay | Admitting: Family Medicine

## 2021-03-25 ENCOUNTER — Ambulatory Visit (INDEPENDENT_AMBULATORY_CARE_PROVIDER_SITE_OTHER): Payer: BC Managed Care – PPO | Admitting: Family Medicine

## 2021-03-25 ENCOUNTER — Other Ambulatory Visit: Payer: Self-pay

## 2021-03-25 VITALS — BP 97/69 | HR 89 | Temp 97.2°F | Ht 67.0 in | Wt 202.5 lb

## 2021-03-25 DIAGNOSIS — R21 Rash and other nonspecific skin eruption: Secondary | ICD-10-CM

## 2021-03-25 MED ORDER — NYSTATIN 100000 UNIT/GM EX CREA: 1.0000 "application " | TOPICAL_CREAM | Freq: Two times a day (BID) | CUTANEOUS | 0 refills | Status: AC

## 2021-03-25 MED ORDER — NYSTATIN 100000 UNIT/GM EX CREA: 1.0000 "application " | TOPICAL_CREAM | Freq: Two times a day (BID) | CUTANEOUS | 0 refills | Status: DC

## 2021-03-25 NOTE — Patient Instructions (Signed)
Rash, Adult A rash is a change in the color of your skin. A rash can also change the way your skin feels. There are many different conditions and factors that can cause a rash. Some rashes may disappear after a few days, but some may last for a few weeks. Common causes of rashes include:  Viral infections, such as: ? Colds. ? Measles. ? Hand, foot, and mouth disease.  Bacterial infections, such as: ? Scarlet fever. ? Impetigo.  Fungal infections, such as Candida.  Allergic reactions to food, medicines, or skin care products. Follow these instructions at home: The goal of treatment is to stop the itching and keep the rash from spreading. Pay attention to any changes in your symptoms. Follow these instructions to help with your condition: Medicine Take or apply over-the-counter and prescription medicines only as told by your health care provider. These may include:  Corticosteroid creams to treat red or swollen skin.  Anti-itch lotions.  Oral allergy medicines (antihistamines).  Oral corticosteroids for severe symptoms.   Skin care  Apply cool compresses to the affected areas.  Do not scratch or rub your skin.  Avoid covering the rash. Make sure the rash is exposed to air as much as possible. Managing itching and discomfort  Avoid hot showers or baths, which can make itching worse. A cold shower may help.  Try taking a bath with: ? Epsom salts. Follow manufacturer instructions on the packaging. You can get these at your local pharmacy or grocery store. ? Baking soda. Pour a small amount into the bath as told by your health care provider. ? Colloidal oatmeal. Follow manufacturer instructions on the packaging. You can get this at your local pharmacy or grocery store.  Try applying baking soda paste to your skin. Stir water into baking soda until it reaches a paste-like consistency.  Try applying calamine lotion. This is an over-the-counter lotion that helps to relieve  itchiness.  Keep cool and out of the sun. Sweating and being hot can make itching worse. General instructions  Rest as needed.  Drink enough fluid to keep your urine pale yellow.  Wear loose-fitting clothing.  Avoid scented soaps, detergents, and perfumes. Use gentle soaps, detergents, perfumes, and other cosmetic products.  Avoid any substance that causes your rash. Keep a journal to help track what causes your rash. Write down: ? What you eat. ? What cosmetic products you use. ? What you drink. ? What you wear. This includes jewelry.  Keep all follow-up visits as told by your health care provider. This is important.   Contact a health care provider if:  You sweat at night.  You lose weight.  You urinate more than normal.  You urinate less than normal, or you notice that your urine is a darker color than usual.  You feel weak.  You vomit.  Your skin or the whites of your eyes look yellow (jaundice).  Your skin: ? Tingles. ? Is numb.  Your rash: ? Does not go away after several days. ? Gets worse.  You are: ? Unusually thirsty. ? More tired than normal.  You have: ? New symptoms. ? Pain in your abdomen. ? A fever. ? Diarrhea. Get help right away if you:  Have a fever and your symptoms suddenly get worse.  Develop confusion.  Have a severe headache or a stiff neck.  Have severe joint pains or stiffness.  Have a seizure.  Develop a rash that covers all or most of your body. The   rash may or may not be painful.  Develop blisters that: ? Are on top of the rash. ? Grow larger or grow together. ? Are painful. ? Are inside your nose or mouth.  Develop a rash that: ? Looks like purple pinprick-sized spots all over your body. ? Has a "bull's eye" or looks like a target. ? Is not related to sun exposure, is red and painful, and causes your skin to peel. Summary  A rash is a change in the color of your skin. Some rashes disappear after a few days,  but some may last for a few weeks.  The goal of treatment is to stop the itching and keep the rash from spreading.  Take or apply over-the-counter and prescription medicines only as told by your health care provider.  Contact a health care provider if you have new or worsening symptoms.  Keep all follow-up visits as told by your health care provider. This is important. This information is not intended to replace advice given to you by your health care provider. Make sure you discuss any questions you have with your health care provider. Document Revised: 04/01/2019 Document Reviewed: 07/12/2018 Elsevier Patient Education  2021 Elsevier Inc.  

## 2021-03-25 NOTE — Progress Notes (Signed)
Acute Office Visit  Subjective:    Patient ID: Deanna Kennedy, female    DOB: March 06, 1966, 55 y.o.   MRN: [id number]  Chief Complaint  Patient presents with  . Rash    HPI Patient is in today for a rash x 1 week. She rash is present at the left AC fossa. The rash is itchy and burns at times. It was previously red, but that has improved. She has been using Aquaphor and mometasone without improvement. She has a history of eczema but she report that this is different. She denies drainage from the rash. No fever or chills.   Past Medical History:  Diagnosis Date  . Hypertension     Past Surgical History:  Procedure Laterality Date  . ANKLE FRACTURE SURGERY Left   . CESAREAN SECTION     x2  . CHOLECYSTECTOMY      Family History  Problem Relation Age of Onset  . Hypertension Mother   . Uterine cancer Mother   . Hypertension Father   . Stomach cancer Father     Social History   Socioeconomic History  . Marital status: Single    Spouse name: Not on file  . Number of children: 2  . Years of education: Not on file  . Highest education level: Not on file  Occupational History  . Not on file  Tobacco Use  . Smoking status: Never Smoker  . Smokeless tobacco: Never Used  Substance and Sexual Activity  . Alcohol use: Yes    Alcohol/week: 6.0 standard drinks    Types: 6 Cans of beer per week  . Drug use: Not on file  . Sexual activity: Not Currently  Other Topics Concern  . Not on file  Social History Narrative   Patient resides locally.  She used to work for [employer] and travels quite frequently.  However, she is now working for [employer] in [place].  She has 1 daughter which resides in [place] and her other day her daughter resides in [place].   Social Determinants of Health   Financial Resource Strain: Not on file  Food Insecurity: Not on file  Transportation Needs: Not on file  Physical Activity: Not on file  Stress: Not on  file  Social Connections: Not on file  Intimate Partner Violence: Not on file    Outpatient Medications Prior to Visit  Medication Sig Dispense Refill  . amLODipine (NORVASC) 10 MG tablet Take 1 tablet (10 mg total) by mouth daily. 90 tablet 3  . Ascorbic Acid (VITAMIN C PO) Take by mouth. Takes 2000 mg daily.    . benazepril (LOTENSIN) 20 MG tablet Take 1 tablet (20 mg total) by mouth daily. 90 tablet 3  . BIOTIN PO Take by mouth daily.    . cetirizine (ZYRTEC) 10 MG tablet Take 1 tablet (10 mg total) by mouth daily. 90 tablet 3  . Cholecalciferol (VITAMIN D3) 50 MCG (2000 UT) TABS Take by mouth daily.    . Cyanocobalamin (B-12 PO) Take by mouth daily.    . fluticasone (FLONASE) 50 MCG/ACT nasal spray Place 2 sprays into both nostrils daily. 48 g 1  . hydrochlorothiazide (MICROZIDE) 12.5 MG capsule Take 1 capsule (12.5 mg total) by mouth daily. 90 capsule 3  . MAGNESIUM PO Take by mouth.    . Multiple Vitamins-Minerals (MULTIVITAMIN WOMENS 50+ ADV PO) Take by mouth daily.    . Multiple Vitamins-Minerals (ZINC PO) Take by mouth.    . Probiotic Product (PROBIOTIC  PO) Take by mouth daily.     No facility-administered medications prior to visit.    Allergies  Allergen Reactions  . Penicillins Hives    Review of Systems As per HPI.     Objective:    Physical Exam Vitals and nursing note reviewed.  Constitutional:      General: She is not in acute distress.    Appearance: Normal appearance. She is not ill-appearing, toxic-appearing or diaphoretic.  Pulmonary:     Effort: Pulmonary effort is normal. No respiratory distress.  Musculoskeletal:     Right lower leg: No edema.     Left lower leg: No edema.  Skin:    General: Skin is warm and dry.     Findings: Rash present. Rash is not crusting, papular, pustular, scaling or vesicular.       Neurological:     General: No focal deficit present.     Mental Status: She is alert and oriented to person, place, and time.   Psychiatric:        Mood and Affect: Mood normal.        Behavior: Behavior normal.     BP 97/69   Pulse 89   Temp (!) 97.2 F (36.2 C) (Temporal)   Ht 5' 7"  (1.702 m)   Wt 202 lb 8 oz (91.9 kg)   LMP  (LMP Unknown)   BMI 31.72 kg/m  Wt Readings from Last 3 Encounters:  03/25/21 202 lb 8 oz (91.9 kg)  02/20/21 209 lb (94.8 kg)  05/22/20 200 lb (90.7 kg)    Health Maintenance Due  Topic Date Due  . Hepatitis C Screening  Never done  . COVID-19 Vaccine (1) Never done  . HIV Screening  Never done  . COLONOSCOPY (Pts 45-38yr Insurance coverage will need to be confirmed)  Never done    There are no preventive care reminders to display for this patient.   Lab Results  Component Value Date   TSH 1.390 05/22/2020   No results found for: WBC, HGB, HCT, MCV, PLT No results found for: NA, K, CHLORIDE, CO2, GLUCOSE, BUN, CREATININE, BILITOT, ALKPHOS, AST, ALT, PROT, ALBUMIN, CALCIUM, ANIONGAP, EGFR, GFR No results found for: CHOL No results found for: HDL No results found for: LDLCALC No results found for: TRIG No results found for: CHOLHDL Lab Results  Component Value Date   HGBA1C 4.8 05/22/2020       Assessment & Plan:   Braden was seen today for rash.  Diagnoses and all orders for this visit:  Rash Has failed mometasone cream. Will try nystatin as below. Return to office for new or worsening symptoms, or if symptoms persist.  -     nystatin cream (MYCOSTATIN); Apply 1 application topically 2 (two) times daily.  The patient indicates understanding of these issues and agrees with the plan.   TGwenlyn Perking FNP

## 2021-03-28 ENCOUNTER — Ambulatory Visit: Payer: BC Managed Care – PPO | Admitting: Nurse Practitioner

## 2021-04-02 ENCOUNTER — Ambulatory Visit: Payer: BC Managed Care – PPO | Admitting: Nurse Practitioner

## 2021-04-02 ENCOUNTER — Encounter: Payer: Self-pay | Admitting: Internal Medicine

## 2021-04-02 NOTE — Progress Notes (Deleted)
Primary Care Physician:  Raliegh Ip, DO Primary Gastroenterologist:  Dr. Jena Gauss   No chief complaint on file.   HPI:   Deanna Kennedy is a 55 y.o. female who presents to schedule colonoscopy.  Nurse/phone triage deferred office visit due to complaints of abdominal pain.  Reviewed information associated with referral including primary care office visit dated 02/20/2021 for abdominal pain and colon cancer screening.  At that time the patient noted 2 weeks of bilateral side and epigastric pain, deemed mild, worse with tight close.  No nausea or vomiting, change in bowel habits, blood in stools.  No heartburn symptoms.  Having regular, soft bowel movements.  Has never had a colonoscopy but has had a negative Cologuard in the past.  Referred to GI the year prior for colonoscopy but had to cancel.  Requesting a new referral.  Overall felt normal exam, no red flags and felt possibly related to lifestyle with unhealthy diet and drastic change in diet.  Also indicated ellipticals workout abdominal muscles which could cause some soreness.  Recommended follow-up for any persistent or worsening symptoms.  Recommended referral to GI for colonoscopy.  Patient saw primary care a month later for a rash, no mention of abdominal pain at that time.  No history of colonoscopy in our system.  Today she states she is doing okay overall.  Past Medical History:  Diagnosis Date  . Hypertension     Past Surgical History:  Procedure Laterality Date  . ANKLE FRACTURE SURGERY Left   . CESAREAN SECTION     x2  . CHOLECYSTECTOMY      Current Outpatient Medications  Medication Sig Dispense Refill  . amLODipine (NORVASC) 10 MG tablet Take 1 tablet (10 mg total) by mouth daily. 90 tablet 3  . Ascorbic Acid (VITAMIN C PO) Take by mouth. Takes 2000 mg daily.    . benazepril (LOTENSIN) 20 MG tablet Take 1 tablet (20 mg total) by mouth daily. 90 tablet 3  . BIOTIN PO Take by mouth daily.    . cetirizine  (ZYRTEC) 10 MG tablet Take 1 tablet (10 mg total) by mouth daily. 90 tablet 3  . Cholecalciferol (VITAMIN D3) 50 MCG (2000 UT) TABS Take by mouth daily.    . Cyanocobalamin (B-12 PO) Take by mouth daily.    . fluticasone (FLONASE) 50 MCG/ACT nasal spray Place 2 sprays into both nostrils daily. 48 g 1  . hydrochlorothiazide (MICROZIDE) 12.5 MG capsule Take 1 capsule (12.5 mg total) by mouth daily. 90 capsule 3  . MAGNESIUM PO Take by mouth.    . Multiple Vitamins-Minerals (MULTIVITAMIN WOMENS 50+ ADV PO) Take by mouth daily.    . Multiple Vitamins-Minerals (ZINC PO) Take by mouth.    . nystatin cream (MYCOSTATIN) Apply 1 application topically 2 (two) times daily. 30 g 0  . Probiotic Product (PROBIOTIC PO) Take by mouth daily.     No current facility-administered medications for this visit.    Allergies as of 04/02/2021 - Review Complete 03/25/2021  Allergen Reaction Noted  . Penicillins Hives 07/28/2014    Family History  Problem Relation Age of Onset  . Hypertension Mother   . Uterine cancer Mother   . Hypertension Father   . Stomach cancer Father     Social History   Socioeconomic History  . Marital status: Single    Spouse name: Not on file  . Number of children: 2  . Years of education: Not on file  . Highest education  level: Not on file  Occupational History  . Not on file  Tobacco Use  . Smoking status: Never Smoker  . Smokeless tobacco: Never Used  Substance and Sexual Activity  . Alcohol use: Yes    Alcohol/week: 6.0 standard drinks    Types: 6 Cans of beer per week  . Drug use: Not on file  . Sexual activity: Not Currently  Other Topics Concern  . Not on file  Social History Narrative   Patient resides locally.  She used to work for [employer] and travels quite frequently.  However, she is now working for [employer] in [place].  She has 1 daughter which resides in [place] and her other day her daughter resides in [place].    Social Determinants of Health   Financial Resource Strain: Not on file  Food Insecurity: Not on file  Transportation Needs: Not on file  Physical Activity: Not on file  Stress: Not on file  Social Connections: Not on file  Intimate Partner Violence: Not on file    Subjective:*** Review of Systems  Constitutional: Negative for chills, fever, malaise/fatigue and weight loss.  HENT: Negative for congestion and sore throat.   Respiratory: Negative for cough and shortness of breath.   Cardiovascular: Negative for chest pain and palpitations.  Gastrointestinal: Negative for abdominal pain, blood in stool, diarrhea, melena, nausea and vomiting.  Musculoskeletal: Negative for joint pain and myalgias.  Skin: Negative for rash.  Neurological: Negative for dizziness and weakness.  Endo/Heme/Allergies: Does not bruise/bleed easily.  Psychiatric/Behavioral: Negative for depression. The patient is not nervous/anxious.   All other systems reviewed and are negative.      Objective: LMP  (LMP Unknown)  Physical Exam Vitals and nursing note reviewed.  Constitutional:      General: She is not in acute distress.    Appearance: Normal appearance. She is well-developed. She is not ill-appearing, toxic-appearing or diaphoretic.  HENT:     Head: Normocephalic and atraumatic.     Nose: No congestion or rhinorrhea.  Eyes:     General: No scleral icterus. Cardiovascular:     Rate and Rhythm: Normal rate and regular rhythm.     Heart sounds: Normal heart sounds.  Pulmonary:     Effort: Pulmonary effort is normal. No respiratory distress.     Breath sounds: Normal breath sounds.  Abdominal:     General: Bowel sounds are normal.     Palpations: Abdomen is soft. There is no hepatomegaly, splenomegaly or mass.     Tenderness: There is no abdominal tenderness. There is no guarding or rebound.     Hernia: No hernia is present.  Skin:    General: Skin is warm and dry.     Coloration: Skin is  not jaundiced.     Findings: No rash.  Neurological:     General: No focal deficit present.     Mental Status: She is alert and oriented to person, place, and time.  Psychiatric:        Attention and Perception: Attention normal.        Mood and Affect: Mood normal.        Speech: Speech normal.        Behavior: Behavior normal.        Thought Content: Thought content normal.        Cognition and Memory: Cognition and memory normal.      Assessment:  ***   Plan: ***    Thank you for allowing [place]  to participate in the care of Deanna L Lilly Cove, DNP, AGNP-C Adult & Gerontological Nurse Practitioner [facility]   04/02/2021 7:53 AM   Disclaimer: This note was dictated with voice recognition software. Similar sounding words can inadvertently be transcribed and may not be corrected upon review.

## 2021-05-09 ENCOUNTER — Other Ambulatory Visit (HOSPITAL_COMMUNITY): Payer: Self-pay | Admitting: Family Medicine

## 2021-05-09 DIAGNOSIS — Z1231 Encounter for screening mammogram for malignant neoplasm of breast: Secondary | ICD-10-CM

## 2021-06-03 ENCOUNTER — Other Ambulatory Visit: Payer: Self-pay | Admitting: Family Medicine

## 2021-06-09 ENCOUNTER — Other Ambulatory Visit: Payer: Self-pay | Admitting: Family Medicine

## 2021-06-10 ENCOUNTER — Other Ambulatory Visit: Payer: Self-pay | Admitting: Family Medicine

## 2021-06-10 ENCOUNTER — Encounter: Payer: Self-pay | Admitting: Family Medicine

## 2021-06-10 ENCOUNTER — Other Ambulatory Visit: Payer: Self-pay | Admitting: *Deleted

## 2021-06-10 ENCOUNTER — Telehealth: Payer: Self-pay | Admitting: Family Medicine

## 2021-06-10 MED ORDER — BENAZEPRIL HCL 20 MG PO TABS: 20.0000 mg | ORAL_TABLET | Freq: Every day | ORAL | 0 refills | Status: DC

## 2021-06-10 NOTE — Telephone Encounter (Signed)
  Prescription Request  06/10/2021  What is the name of the medication or equipment? benazepril (LOTENSIN) 20 MG tablet   Have you contacted your pharmacy to request a refill? (if applicable) Yes, she was told insurance will not cover 30 day supply she needs a 90 day supply  Which pharmacy would you like this sent to? [facility] pt has appt on 08/09/2021 with DR. Reece Agar. Needs enough until then    Patient notified that their request is being sent to the clinical staff for review and that they should receive a response within 2 business days.

## 2021-06-10 NOTE — Telephone Encounter (Signed)
90 day script sent 

## 2021-07-01 ENCOUNTER — Ambulatory Visit (HOSPITAL_COMMUNITY): Payer: BC Managed Care – PPO

## 2021-07-02 ENCOUNTER — Other Ambulatory Visit: Payer: Self-pay | Admitting: Family Medicine

## 2021-07-23 DIAGNOSIS — Z1231 Encounter for screening mammogram for malignant neoplasm of breast: Secondary | ICD-10-CM | POA: Diagnosis not present

## 2021-08-01 ENCOUNTER — Other Ambulatory Visit: Payer: Self-pay | Admitting: Family Medicine

## 2021-08-09 ENCOUNTER — Ambulatory Visit: Payer: BC Managed Care – PPO | Admitting: Family Medicine

## 2021-08-28 ENCOUNTER — Ambulatory Visit: Payer: BC Managed Care – PPO | Admitting: Family Medicine

## 2021-08-30 ENCOUNTER — Other Ambulatory Visit: Payer: Self-pay | Admitting: Family Medicine

## 2021-09-06 ENCOUNTER — Other Ambulatory Visit: Payer: Self-pay | Admitting: Family Medicine

## 2021-10-02 ENCOUNTER — Encounter: Payer: Self-pay | Admitting: Family Medicine

## 2021-10-02 ENCOUNTER — Ambulatory Visit: Payer: BC Managed Care – PPO | Admitting: Family Medicine

## 2021-10-06 ENCOUNTER — Other Ambulatory Visit: Payer: Self-pay | Admitting: Family Medicine

## 2021-10-07 NOTE — Telephone Encounter (Signed)
[place]. 30 days given 08/30/21 NS for 10/02/21 CPE

## 2021-10-14 ENCOUNTER — Other Ambulatory Visit: Payer: Self-pay | Admitting: Family Medicine

## 2021-10-17 ENCOUNTER — Other Ambulatory Visit: Payer: Self-pay | Admitting: Family Medicine

## 2021-10-18 ENCOUNTER — Other Ambulatory Visit: Payer: Self-pay | Admitting: Family Medicine

## 2021-10-21 ENCOUNTER — Encounter: Payer: Self-pay | Admitting: Family Medicine

## 2021-10-21 ENCOUNTER — Other Ambulatory Visit: Payer: Self-pay | Admitting: Family Medicine

## 2021-10-21 MED ORDER — AMLODIPINE BESYLATE 10 MG PO TABS: 10.0000 mg | ORAL_TABLET | Freq: Every day | ORAL | 0 refills | Status: AC

## 2021-10-21 NOTE — Telephone Encounter (Signed)
Patient calling to see if her Amlodipine has been called in. Has appt in January and needs enough to get her through.

## 2021-10-21 NOTE — Telephone Encounter (Signed)
Refills for Amlodipine were denied for pt with reason stating that pt is due for an appt.  Pt has an appt to see Dr Nadine Counts on 12/26/21 for med refill but needs medicine called in to pharmacy to last her until her appt. Couldn't schedule to see Dr Nadine Counts any sooner because Dr Nadine Counts is booking out until January 2023.

## 2021-10-21 NOTE — Telephone Encounter (Signed)
This will be the LAST fill until she is seen.  She has not been seen in over 1.5 years and has had no show and multiple cancellations.

## 2021-11-12 DIAGNOSIS — L309 Dermatitis, unspecified: Secondary | ICD-10-CM | POA: Diagnosis not present

## 2021-11-12 DIAGNOSIS — Z0000 Encounter for general adult medical examination without abnormal findings: Secondary | ICD-10-CM | POA: Diagnosis not present

## 2021-11-12 DIAGNOSIS — Z124 Encounter for screening for malignant neoplasm of cervix: Secondary | ICD-10-CM | POA: Diagnosis not present

## 2021-11-12 DIAGNOSIS — J309 Allergic rhinitis, unspecified: Secondary | ICD-10-CM | POA: Diagnosis not present

## 2021-11-12 DIAGNOSIS — I10 Essential (primary) hypertension: Secondary | ICD-10-CM | POA: Diagnosis not present

## 2021-11-12 DIAGNOSIS — Z8349 Family history of other endocrine, nutritional and metabolic diseases: Secondary | ICD-10-CM | POA: Diagnosis not present

## 2021-12-26 ENCOUNTER — Ambulatory Visit: Payer: BC Managed Care – PPO | Admitting: Family Medicine

## 2022-03-25 ENCOUNTER — Other Ambulatory Visit: Payer: Self-pay | Admitting: Family Medicine

## 2022-04-22 ENCOUNTER — Other Ambulatory Visit: Payer: Self-pay | Admitting: Emergency Medicine

## 2022-04-22 ENCOUNTER — Ambulatory Visit
Admission: RE | Admit: 2022-04-22 | Discharge: 2022-04-22 | Disposition: A | Discharge status: Home / Self Care | Payer: Worker's Compensation | Source: Ambulatory Visit | Attending: Emergency Medicine | Admitting: Emergency Medicine

## 2022-04-22 DIAGNOSIS — T1490XA Injury, unspecified, initial encounter: Secondary | ICD-10-CM

## 2022-07-26 ENCOUNTER — Emergency Department (HOSPITAL_COMMUNITY): Payer: 59

## 2022-07-26 ENCOUNTER — Emergency Department (HOSPITAL_COMMUNITY)
Admission: EM | Admit: 2022-07-26 | Discharge: 2022-07-26 | Disposition: A | Discharge status: Home / Self Care | Payer: 59 | Attending: Emergency Medicine | Admitting: Emergency Medicine

## 2022-07-26 ENCOUNTER — Other Ambulatory Visit: Payer: Self-pay

## 2022-07-26 DIAGNOSIS — I10 Essential (primary) hypertension: Secondary | ICD-10-CM | POA: Diagnosis not present

## 2022-07-26 DIAGNOSIS — R002 Palpitations: Secondary | ICD-10-CM | POA: Diagnosis not present

## 2022-07-26 DIAGNOSIS — Z79899 Other long term (current) drug therapy: Secondary | ICD-10-CM | POA: Insufficient documentation

## 2022-07-26 LAB — I-STAT BETA HCG BLOOD, ED (MC, WL, AP ONLY): I-stat hCG, quantitative: <5 m[IU]/mL (ref <5)

## 2022-07-26 LAB — CBC
HCT: 31.2 % — ABNORMAL LOW (ref 36.0–46.0)
Hemoglobin: 10.1 g/dL — ABNORMAL LOW (ref 12.0–15.0)
MCH: 29.2 pg (ref 26.0–34.0)
MCHC: 32.4 g/dL (ref 30.0–36.0)
MCV: 90.2 fL (ref 80.0–100.0)
Platelets: 299 10*3/uL (ref 150–400)
RBC: 3.46 MIL/uL — ABNORMAL LOW (ref 3.87–5.11)
RDW: 13 % (ref 11.5–15.5)
WBC: 4.2 10*3/uL (ref 4.0–10.5)
nRBC: 0 % (ref 0.0–0.2)

## 2022-07-26 LAB — BASIC METABOLIC PANEL
Anion gap: 9 (ref 5–15)
BUN: 20 mg/dL (ref 6–20)
CO2: 21 mmol/L — ABNORMAL LOW (ref 22–32)
Calcium: 9.3 mg/dL (ref 8.9–10.3)
Chloride: 107 mmol/L (ref 98–111)
Creatinine, Ser: 1.03 mg/dL — ABNORMAL HIGH (ref 0.44–1.00)
GFR, Estimated: >60 mL/min (ref >60)
Glucose, Bld: 90 mg/dL (ref 70–99)
Potassium: 3.5 mmol/L (ref 3.5–5.1)
Sodium: 137 mmol/L (ref 135–145)

## 2022-07-26 LAB — TROPONIN I (HIGH SENSITIVITY)
Troponin I (High Sensitivity): 3 ng/L (ref <18)
Troponin I (High Sensitivity): 3 ng/L (ref <18)

## 2022-07-26 LAB — TSH: TSH: 1.081 u[IU]/mL (ref 0.350–4.500)

## 2022-07-26 NOTE — ED Notes (Signed)
Patient verbalizes understanding of d/c instructions. Opportunities for questions and answers were provided. Pt d/c from ED and ambulated to lobby.  

## 2022-07-26 NOTE — ED Triage Notes (Signed)
Pt c/o "skip or flutter" sensation in her chest that has been intermittent since Monday. Palpitation associated with chest discomfort and shortness of breath. No known cardiac hx.

## 2022-07-26 NOTE — ED Provider Notes (Signed)
[facility] EMERGENCY DEPARTMENT Provider Note   CSN: [id number] Arrival date & time: 07/26/22  0018     History  Chief Complaint  Patient presents with   Palpitations    Deanna Kennedy is a 56 y.o. female history of pleasant presents to the emergency department with concern for sensation of fluttering in the left side of her chest that has been intermittent for the last week.  She denies any chest pain, discomfort, or shortness of breath associated with the palpitations to this provider.  She states that the palpitations are only present when she is resting, she has never had any symptoms when she is active and in fact feels that they resolve if she begins activity while she is experiencing them.  She has not been seen by cardiologist.  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  I have personally reviewed her medical records.  History of hypertension currently on amlodipine and benazepril for management of this. HPI     Home Medications Prior to Admission medications   Medication Sig Start Date End Date Taking? Authorizing Provider  amLODipine (NORVASC) 10 MG tablet Take 1 tablet (10 mg total) by mouth daily. Absolutely NO further fills until she is seen IN OFFICE 10/21/21   Delynn Flavin M, DO  Ascorbic Acid (VITAMIN C PO) Take by mouth. Takes 2000 mg daily.    [provider]  benazepril (LOTENSIN) 20 MG tablet TAKE 1 TABLET BY MOUTH EVERY DAY 09/06/21   Delynn Flavin M, DO  BIOTIN PO Take by mouth daily.    [provider]  cetirizine (ZYRTEC) 10 MG tablet Take 1 tablet (10 mg total) by mouth daily. 05/22/20 05/22/21  Raliegh Ip, DO  Cholecalciferol (VITAMIN D3) 50 MCG (2000 UT) TABS Take by mouth daily.    [provider]  Cyanocobalamin (B-12 PO) Take by mouth daily.    [provider]  fluticasone (FLONASE) 50  MCG/ACT nasal spray Place 2 sprays into both nostrils daily. 03/13/21   Raliegh Ip, DO  hydrochlorothiazide (MICROZIDE) 12.5 MG capsule Take 1 capsule (12.5 mg total) by mouth daily. 05/22/20   Raliegh Ip, DO  MAGNESIUM PO Take by mouth.    [provider]  Multiple Vitamins-Minerals (MULTIVITAMIN WOMENS 50+ ADV PO) Take by mouth daily.    [provider]  Multiple Vitamins-Minerals (ZINC PO) Take by mouth.    [provider]  nystatin cream (MYCOSTATIN) Apply 1 application topically 2 (two) times daily. 03/25/21   Gabriel Earing, FNP  Probiotic Product (PROBIOTIC PO) Take by mouth daily.    [provider]      Allergies    Penicillins    Review of Systems   Review of Systems  Constitutional: Negative.   HENT: Negative.    Respiratory: Negative.    Cardiovascular:  Positive for palpitations. Negative for chest pain and leg swelling.  Gastrointestinal: Negative.   Genitourinary: Negative.   Musculoskeletal: Negative.   Skin: Negative.   Neurological: Negative.     Physical Exam Updated Vital Signs BP 116/78   Pulse 61   Temp 97.8 F (36.6 C) (Oral)   Resp 15   Ht 5\' 7"  (1.702 m)   Wt 91.6 kg   LMP  (LMP Unknown)   SpO2 100%   BMI 31.64 kg/m  Physical Exam Vitals and nursing note reviewed.  Constitutional:      Appearance: She is not ill-appearing or toxic-appearing.  HENT:     Head: Normocephalic and atraumatic.     Nose: Nose normal.     Mouth/Throat:     Mouth: Mucous membranes are moist.     Pharynx: No oropharyngeal exudate or posterior oropharyngeal erythema.  Eyes:     General:        Right eye: No discharge.        Left eye: No discharge.     Extraocular Movements: Extraocular movements intact.     Conjunctiva/sclera: Conjunctivae normal.     Pupils: Pupils are equal, round, and reactive to light.  Cardiovascular:     Rate and Rhythm: Normal rate and regular rhythm.     Pulses: Normal pulses.      Heart sounds: Normal heart sounds. No murmur heard. Pulmonary:     Effort: Pulmonary effort is normal. No respiratory distress.     Breath sounds: Normal breath sounds. No wheezing or rales.  Abdominal:     General: Bowel sounds are normal. There is no distension.     Palpations: Abdomen is soft.     Tenderness: There is no abdominal tenderness. There is no right CVA tenderness, left CVA tenderness, guarding or rebound.  Musculoskeletal:        General: No deformity.     Cervical back: Neck supple.  Skin:    General: Skin is warm and dry.     Capillary Refill: Capillary refill takes less than 2 seconds.  Neurological:     General: No focal deficit present.     Mental Status: She is alert and oriented to person, place, and time. Mental status is at baseline.  Psychiatric:        Mood and Affect: Mood normal.     ED Results / Procedures / Treatments   Labs (all labs ordered are listed, but only abnormal results are displayed) Labs Reviewed  BASIC METABOLIC PANEL - Abnormal; Notable for the following components:      Result Value   CO2 21 (*)    Creatinine, Ser 1.03 (*)    All other components within normal limits  CBC - Abnormal; Notable for the following components:   RBC 3.46 (*)    Hemoglobin 10.1 (*)    HCT 31.2 (*)    All other components within normal limits  TSH  I-STAT BETA HCG BLOOD, ED ([facility], [facility], [facility] ONLY)  TROPONIN I (HIGH SENSITIVITY)  TROPONIN I (HIGH SENSITIVITY)    EKG None  Radiology DG Chest 2 View  Result Date: 07/26/2022 CLINICAL DATA:  Chest pain, shortness of breath, palpitations EXAM: CHEST - 2 VIEW COMPARISON:  None Available. FINDINGS: The heart size and mediastinal contours are within normal limits. Both lungs are clear. The visualized skeletal structures are unremarkable. IMPRESSION: No active cardiopulmonary disease. Electronically Signed   By: Charlett Nose M.D.   On: 07/26/2022 01:07    Procedures Procedures    Medications Ordered in  ED Medications - No data to display  ED Course/ Medical Decision Making/ A&P                           Medical Decision Making 56 year old female who presents with concern for intermittent palpitations for the last week, asymptomatic throughout her stay in the emergency department.   Patient mildly hypertensive on intake, vital signs otherwise normal.  Cardiopulmonary abdominal exams are benign.  Patient is  neurovascular intact in all extremities.   Differential in this includes limited to dysrhythmia, metabolic derangement such as electrolyte abnormality, thyroid anomaly, anxiety, heart block, ingestion, drug effect, cardiomyopathy.    Amount and/or Complexity of Data Reviewed Labs: ordered.    Details: CBC without leukocytosis and with mild anemia with hemoglobin of 10.1.  BMP unremarkable.  Patient is not pregnant and troponin is negative x2.  TSH is normal at 1.08. Radiology: ordered.    Details: Chest x-ray negative for acute cardiopulmonary disease as visualized by this provider.  Agree with radiologist interpretation. ECG/medicine tests: independent interpretation performed.    Details: EKG with normal sinus rhythm without STEMI.   Patient has remained with normal sinus rhythm on the cardiac monitoring throughout her stay in the emergency department.  On the exact etiology of her symptomatology remains unclear there does not appear to be any emergent source at this time.  No evidence of acute dysrhythmia or metabolic concern at this time.  We will plan to discharge patient home with close outpatient Cardiologic follow-up and strict return precautions. Considered admission, however do not feel this would be necessary at this time.   Vincenzina voiced understanding of her medical evaluation and treatment plan. Each of their questions answered to their expressed satisfaction.  Return precautions were given.  Patient is well-appearing, stable, and was discharged in good condition.  This  chart was dictated using voice recognition software, Dragon. Despite the best efforts of this provider to proofread and correct errors, errors may still occur which can change documentation meaning. Final Clinical Impression(s) / ED Diagnoses Final diagnoses:  Palpitations    Rx / DC Orders ED Discharge Orders     None         Sherrilee Gilles 07/26/22 2241    Nira Conn, MD 07/28/22 [phone]

## 2022-07-26 NOTE — Discharge Instructions (Signed)
You are seen here today for your palpitations.  Physical exam and vital signs as well as your blood work are very reassuring.  You opted to be discharged after your thyroid level was drawn.  You may follow this up in your MyChart account in the outpatient setting with your primary care doctor.  Should you experience recurrent palpitations or new chest pain or shortness of breath please return to the ER immediately.  Follow-up in the outpatient setting with the cardiologist listed below.  Return to the ER with any new severe symptoms.

## 2022-10-11 LAB — COLOGUARD: COLOGUARD: NEGATIVE
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
## 2023-03-16 ENCOUNTER — Ambulatory Visit
Admission: RE | Admit: 2023-03-16 | Discharge: 2023-03-16 | Disposition: A | Discharge status: Home / Self Care | Payer: Worker's Compensation | Source: Ambulatory Visit | Attending: Emergency Medicine | Admitting: Emergency Medicine

## 2023-03-16 ENCOUNTER — Other Ambulatory Visit: Payer: Self-pay | Admitting: Emergency Medicine

## 2023-03-16 DIAGNOSIS — S6992XA Unspecified injury of left wrist, hand and finger(s), initial encounter: Secondary | ICD-10-CM

## 2023-09-21 ENCOUNTER — Ambulatory Visit: Payer: 59 | Admitting: Orthopaedic Surgery

## 2023-09-23 ENCOUNTER — Ambulatory Visit: Payer: 59 | Admitting: Orthopaedic Surgery

## 2025-01-25 ENCOUNTER — Encounter (INDEPENDENT_AMBULATORY_CARE_PROVIDER_SITE_OTHER): Payer: Self-pay | Admitting: *Deleted

## 2025-03-14 ENCOUNTER — Ambulatory Visit (INDEPENDENT_AMBULATORY_CARE_PROVIDER_SITE_OTHER): Admitting: Gastroenterology
